# Patient Record
Sex: Male | Born: 1990 | Race: White | Hispanic: No | Marital: Single | Smoking: Current every day smoker
Health system: Southern US, Community
[De-identification: ages and names within clinical notes are randomized; demographics above are authoritative.]

---
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
## 2011-07-14 ENCOUNTER — Emergency Department: Payer: Self-pay | Admitting: Emergency Medicine

## 2011-07-14 LAB — URINALYSIS, COMPLETE
Leukocyte Esterase: NEGATIVE
Nitrite: NEGATIVE
Ph: 5 (ref 4.5–8.0)
Protein: NEGATIVE
RBC,UR: 3 /HPF (ref 0–5)

## 2012-05-02 ENCOUNTER — Emergency Department: Payer: Self-pay | Admitting: Emergency Medicine

## 2012-05-31 ENCOUNTER — Emergency Department: Payer: Self-pay | Admitting: Emergency Medicine

## 2012-05-31 LAB — COMPREHENSIVE METABOLIC PANEL
Albumin: 5.4 g/dL — ABNORMAL HIGH (ref 3.4–5.0)
Alkaline Phosphatase: 107 U/L (ref 50–136)
Bilirubin,Total: 0.3 mg/dL (ref 0.2–1.0)
Calcium, Total: 8.9 mg/dL (ref 8.5–10.1)
Co2: 26 mmol/L (ref 21–32)
Creatinine: 1.03 mg/dL (ref 0.60–1.30)
EGFR (Non-African Amer.): >60
Glucose: 86 mg/dL (ref 65–99)
Osmolality: 285 (ref 275–301)
Potassium: 3.9 mmol/L (ref 3.5–5.1)
Total Protein: 8.9 g/dL — ABNORMAL HIGH (ref 6.4–8.2)

## 2012-05-31 LAB — CBC WITH DIFFERENTIAL/PLATELET
Basophil #: 0.1 10*3/uL (ref 0.0–0.1)
HCT: 45 % (ref 40.0–52.0)
Lymphocyte %: 13.5 %
MCV: 87 fL (ref 80–100)
Monocyte %: 7.4 %
Neutrophil #: 9.8 10*3/uL — ABNORMAL HIGH (ref 1.4–6.5)
Neutrophil %: 78.5 %
Platelet: 210 10*3/uL (ref 150–440)
RBC: 5.16 10*6/uL (ref 4.40–5.90)
WBC: 12.5 10*3/uL — ABNORMAL HIGH (ref 3.8–10.6)

## 2012-05-31 LAB — URINALYSIS, COMPLETE
Bilirubin,UR: NEGATIVE
Ketone: NEGATIVE
Leukocyte Esterase: NEGATIVE
Ph: 5 (ref 4.5–8.0)
Protein: 30
Specific Gravity: 1.006 (ref 1.003–1.030)

## 2012-05-31 LAB — DRUG SCREEN, URINE
Amphetamines, Ur Screen: NEGATIVE (ref ?–1000)
Cannabinoid 50 Ng, Ur ~~LOC~~: NEGATIVE (ref ?–50)
Cocaine Metabolite,Ur ~~LOC~~: POSITIVE (ref ?–300)
Methadone, Ur Screen: NEGATIVE (ref ?–300)
Opiate, Ur Screen: POSITIVE (ref ?–300)
Phencyclidine (PCP) Ur S: NEGATIVE (ref ?–25)
Tricyclic, Ur Screen: NEGATIVE (ref ?–1000)

## 2012-05-31 LAB — CBC
HCT: 47.7 % (ref 40.0–52.0)
HGB: 16.3 g/dL (ref 13.0–18.0)
MCH: 29.5 pg (ref 26.0–34.0)
MCHC: 34.1 g/dL (ref 32.0–36.0)
MCV: 86 fL (ref 80–100)
Platelet: 260 10*3/uL (ref 150–440)
RBC: 5.53 10*6/uL (ref 4.40–5.90)
WBC: 20.3 10*3/uL — ABNORMAL HIGH (ref 3.8–10.6)

## 2012-05-31 LAB — ETHANOL: Ethanol: 242 mg/dL

## 2012-05-31 LAB — TSH: Thyroid Stimulating Horm: 3.75 u[IU]/mL

## 2013-06-11 ENCOUNTER — Ambulatory Visit: Payer: Self-pay | Admitting: Family Medicine

## 2014-05-20 ENCOUNTER — Emergency Department: Payer: Self-pay | Admitting: Emergency Medicine

## 2014-05-20 LAB — CBC
HCT: 43.4 % (ref 40.0–52.0)
HGB: 14.3 g/dL (ref 13.0–18.0)
MCH: 28.8 pg (ref 26.0–34.0)
MCHC: 32.9 g/dL (ref 32.0–36.0)
MCV: 88 fL (ref 80–100)
PLATELETS: 248 10*3/uL (ref 150–440)
RBC: 4.94 10*6/uL (ref 4.40–5.90)
RDW: 13 % (ref 11.5–14.5)
WBC: 10.1 10*3/uL (ref 3.8–10.6)

## 2014-05-20 LAB — COMPREHENSIVE METABOLIC PANEL
ALBUMIN: 4.1 g/dL (ref 3.4–5.0)
ANION GAP: 7 (ref 7–16)
AST: 30 U/L (ref 15–37)
Alkaline Phosphatase: 95 U/L
BUN: 14 mg/dL (ref 7–18)
Bilirubin,Total: 0.3 mg/dL (ref 0.2–1.0)
CALCIUM: 9.2 mg/dL (ref 8.5–10.1)
CHLORIDE: 106 mmol/L (ref 98–107)
CO2: 29 mmol/L (ref 21–32)
Creatinine: 1.1 mg/dL (ref 0.60–1.30)
EGFR (Non-African Amer.): >60
Glucose: 80 mg/dL (ref 65–99)
Osmolality: 283 (ref 275–301)
POTASSIUM: 3.6 mmol/L (ref 3.5–5.1)
SGPT (ALT): 24 U/L
SODIUM: 142 mmol/L (ref 136–145)
Total Protein: 7.5 g/dL (ref 6.4–8.2)

## 2014-05-20 LAB — URINALYSIS, COMPLETE
Bacteria: NONE SEEN
Bilirubin,UR: NEGATIVE
Glucose,UR: NEGATIVE mg/dL (ref 0–75)
Ketone: NEGATIVE
Leukocyte Esterase: NEGATIVE
NITRITE: NEGATIVE
PROTEIN: NEGATIVE
Ph: 6 (ref 4.5–8.0)
RBC,UR: 11 /HPF (ref 0–5)
SPECIFIC GRAVITY: 1.016 (ref 1.003–1.030)
Squamous Epithelial: NONE SEEN

## 2014-05-20 LAB — CK: CK, Total: 308 U/L (ref 39–308)

## 2014-05-20 LAB — CK-MB: CK-MB: 2.2 ng/mL (ref 0.5–3.6)

## 2014-09-05 NOTE — Consult Note (Signed)
Details:    - Psychiatry: Patient seen. Patient is now sober and is cam and showing no sign of withdrawl. Affect calm and patient is lucid. Totally denies suicidal ideation and is not psychotic or manic and has improved insight. Vitals and labs reviewed. Patient does not need inpatient treatment. Education and supportive therapy done. Rec discharge patient home with instruction not to drink alcohol and a [facility]. Dx alcohhol abuse, mood disorder nos, ro ADHD. Discussed with ER MD who agrees to the plan.   Electronic Signatures: Audery Amellapacs, Sheral Pfahler T (MD)  (Signed 17-Jan-14 15:23)  Authored: Details   Last Updated: 17-Jan-14 15:23 by Audery Amellapacs, Deni Lefever T (MD)

## 2014-09-05 NOTE — Consult Note (Signed)
Brief Consult Note: Diagnosis: Bipolar Do NOS, Alcohol Abuse.   Patient was seen by consultant.   Consult note dictated.   Orders entered.   Comments: Pt to be monitored in ED for further stabilization and safety.  Once stable, [facility]  Electronic Signatures: Rhunette CroftFaheem, Abdulrahman Bracey S (MD)  (Signed 16-Jan-14 16:48)  Authored: Brief Consult Note   Last Updated: 16-Jan-14 16:48 by Rhunette CroftFaheem, Najwa Spillane S (MD)
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
## 2014-10-08 ENCOUNTER — Emergency Department (HOSPITAL_COMMUNITY): Payer: Worker's Compensation

## 2014-10-08 ENCOUNTER — Emergency Department (HOSPITAL_COMMUNITY)
Admission: EM | Admit: 2014-10-08 | Discharge: 2014-10-08 | Disposition: A | Discharge status: Home / Self Care | Payer: Worker's Compensation | Attending: Emergency Medicine | Admitting: Emergency Medicine

## 2014-10-08 ENCOUNTER — Encounter (HOSPITAL_COMMUNITY): Payer: Self-pay | Admitting: *Deleted

## 2014-10-08 DIAGNOSIS — Y9389 Activity, other specified: Secondary | ICD-10-CM | POA: Diagnosis not present

## 2014-10-08 DIAGNOSIS — S0990XA Unspecified injury of head, initial encounter: Secondary | ICD-10-CM | POA: Diagnosis not present

## 2014-10-08 DIAGNOSIS — Z720 Tobacco use: Secondary | ICD-10-CM | POA: Insufficient documentation

## 2014-10-08 DIAGNOSIS — Y9289 Other specified places as the place of occurrence of the external cause: Secondary | ICD-10-CM | POA: Insufficient documentation

## 2014-10-08 DIAGNOSIS — W19XXXA Unspecified fall, initial encounter: Secondary | ICD-10-CM

## 2014-10-08 DIAGNOSIS — S8991XA Unspecified injury of right lower leg, initial encounter: Secondary | ICD-10-CM | POA: Diagnosis not present

## 2014-10-08 DIAGNOSIS — W11XXXA Fall on and from ladder, initial encounter: Secondary | ICD-10-CM | POA: Diagnosis not present

## 2014-10-08 DIAGNOSIS — S20229A Contusion of unspecified back wall of thorax, initial encounter: Secondary | ICD-10-CM | POA: Insufficient documentation

## 2014-10-08 DIAGNOSIS — S99911A Unspecified injury of right ankle, initial encounter: Secondary | ICD-10-CM | POA: Diagnosis not present

## 2014-10-08 DIAGNOSIS — F0390 Unspecified dementia without behavioral disturbance: Secondary | ICD-10-CM | POA: Diagnosis not present

## 2014-10-08 DIAGNOSIS — S8992XA Unspecified injury of left lower leg, initial encounter: Secondary | ICD-10-CM | POA: Diagnosis not present

## 2014-10-08 DIAGNOSIS — Y990 Civilian activity done for income or pay: Secondary | ICD-10-CM | POA: Diagnosis not present

## 2014-10-08 DIAGNOSIS — S3992XA Unspecified injury of lower back, initial encounter: Secondary | ICD-10-CM | POA: Diagnosis present

## 2014-10-08 MED ORDER — HYDROCODONE-ACETAMINOPHEN 5-325 MG PO TABS: 1.0000 | ORAL_TABLET | Freq: Four times a day (QID) | ORAL | Status: DC | PRN

## 2014-10-08 MED ORDER — IBUPROFEN 600 MG PO TABS: 600.0000 mg | ORAL_TABLET | Freq: Four times a day (QID) | ORAL | Status: DC | PRN

## 2014-10-08 MED ORDER — KETOROLAC TROMETHAMINE 60 MG/2ML IM SOLN
60.0000 mg | Freq: Once | INTRAMUSCULAR | Status: AC
  Administered 2014-10-08: 60 mg via INTRAMUSCULAR
  Filled 2014-10-08: qty 2

## 2014-10-08 MED ORDER — DIAZEPAM 5 MG PO TABS
5.0000 mg | ORAL_TABLET | Freq: Once | ORAL | Status: AC
  Administered 2014-10-08: 5 mg via ORAL
  Filled 2014-10-08: qty 1

## 2014-10-08 MED ORDER — HYDROMORPHONE HCL 1 MG/ML IJ SOLN
1.0000 mg | Freq: Once | INTRAMUSCULAR | Status: AC
  Administered 2014-10-08: 1 mg via INTRAMUSCULAR
  Filled 2014-10-08: qty 1

## 2014-10-08 NOTE — ED Notes (Signed)
Per EMS pt coming from work with c/o fall of ladder (12 feet), pt reports low back pain.

## 2014-10-08 NOTE — ED Provider Notes (Signed)
CSN: [id number]     Arrival date & time 10/08/14  1106 History   First MD Initiated Contact with Patient 10/08/14 1107     Chief Complaint  Patient presents with  . Fall     (Consider location/radiation/quality/duration/timing/severity/associated sxs/prior Treatment) HPI Jared Meyer is a 24 y.o. male with the medical problems, presents to emergency department after a fall of the latter. Patient states he was on the ladder working on the second floor window, when he slid off the ladder and fell down. States he fell backwards onto grass. He reports hitting his head. Reports severe pain in the lower back and right ankle. Denies loss of consciousness. No dizziness, nausea, vomiting, confusion. He states he was unable to get up because of the back pain. EMS was called. Patient denies any history of prior back problems. He denies any numbness or weakness in extremities. He denies any bladder or bowel loss. Pt was immobilized in a c-collar and long spine board by EMS.     History reviewed. No pertinent past medical history. History reviewed. No pertinent past surgical history. No family history on file. History  Substance Use Topics  . Smoking status: Current Every Day Smoker -- 1.00 packs/day    Types: Cigarettes  . Smokeless tobacco: Not on file  . Alcohol Use: Yes     Comment: occasionally    Review of Systems  Constitutional: Negative for fever and chills.  Respiratory: Negative for cough, chest tightness and shortness of breath.   Cardiovascular: Negative for chest pain, palpitations and leg swelling.  Gastrointestinal: Negative for nausea, vomiting, abdominal pain, diarrhea and abdominal distention.  Genitourinary: Negative for dysuria, urgency, frequency and hematuria.  Musculoskeletal: Positive for back pain, joint swelling and arthralgias. Negative for myalgias, neck pain and neck stiffness.  Skin: Negative for rash.  Allergic/Immunologic: Negative for immunocompromised state.   Neurological: Positive for headaches. Negative for dizziness, weakness, light-headedness and numbness.  All other systems reviewed and are negative.     Allergies  Review of patient's allergies indicates no known allergies.  Home Medications   Prior to Admission medications   Not on File   BP 116/63 mmHg  Pulse 66  Temp(Src) 98.2 F (36.8 C) (Oral)  Resp 14  SpO2 98% Physical Exam  Constitutional: He is oriented to person, place, and time. He appears well-developed and well-nourished. No distress.  HENT:  Head: Normocephalic and atraumatic.  No hemotympanum  Eyes: Conjunctivae and EOM are normal. Pupils are equal, round, and reactive to light.  Neck: Normal range of motion. Neck supple.  Cardiovascular: Normal rate, regular rhythm and normal heart sounds.   Pulmonary/Chest: Effort normal. No respiratory distress. He has no wheezes. He has no rales.  Abdominal: Soft. Bowel sounds are normal. He exhibits no distension. There is no tenderness. There is no rebound.  Musculoskeletal: He exhibits no edema.  Bruising and small abrasion to the lumbar spine. No midline thoracic spine tenderness. Tender to palpation of the lumbar spine and paravertebral muscles. Pain with bilateral straight leg raise. 4 dementia bilateral arms and legs. Tender palpation of her medial malleolus of right ankle. Full range of motion of the ankle.  Neurological: He is alert and oriented to person, place, and time. No cranial nerve deficit. Coordination normal.  5 out of 5 and equal upper and lower extremity strength bilaterally.  Skin: Skin is warm and dry.  Nursing note and vitals reviewed.   ED Course  Procedures (including critical care time) Labs Review Labs  Reviewed - No data to display  Imaging Review Dg Lumbar Spine Complete  10/08/2014   CLINICAL DATA:  Twenty-one 66-year-old who fell approximately 20 feet off of a ladder earlier today. Low back pain. Initial encounter.  EXAM: LUMBAR SPINE -  COMPLETE 4+ VIEW  COMPARISON:  None.  FINDINGS: Five non rib-bearing lumbar vertebrae with anatomic alignment posteriorly. No fractures. Well preserved disc spaces. Slight thoracolumbar scoliosis convex right. No pars defects. No significant facet arthropathy. Visualized sacroiliac joints intact.  IMPRESSION: 1. No acute osseous abnormality. 2. Straightening of the usual lordosis which may reflect positioning or spasm. 3. Slight thoracolumbar scoliosis convex right.   Electronically Signed   By: Hulan Saas M.D.   On: 10/08/2014 12:21   Dg Ankle Complete Right  10/08/2014   CLINICAL DATA:  20 foot fall this morning, right foot and ankle injury with soft tissue swelling laterally, initial encounter.  EXAM: RIGHT ANKLE - COMPLETE 3+ VIEW  COMPARISON:  None.  FINDINGS: No acute osseous or joint abnormality.  IMPRESSION: No acute osseous or joint abnormality.   Electronically Signed   By: Leanna Battles M.D.   On: 10/08/2014 12:23   Ct Head Wo Contrast  10/08/2014   CLINICAL DATA:  Larey Seat while working replacing a window 20 feet from ladder. Headache and back pain.  EXAM: CT HEAD WITHOUT CONTRAST  CT CERVICAL SPINE WITHOUT CONTRAST  TECHNIQUE: Multidetector CT imaging of the head and cervical spine was performed following the standard protocol without intravenous contrast. Multiplanar CT image reconstructions of the cervical spine were also generated.  COMPARISON:  None.  FINDINGS: CT HEAD FINDINGS  Ventricles, cisterns and other CSF spaces are within normal. There is no mass, mass effect, shift of midline structures or acute hemorrhage. There is no evidence of acute infarction. Mild irregularity to the nasal bone which may be from acute or chronic injury. Remaining bony structures and soft tissues are within normal.  CT CERVICAL SPINE FINDINGS  Vertebral body alignment, heights and disc spaces are normal. Prevertebral soft tissues as well as the atlantoaxial articulation are normal. There is no acute fracture  or subluxation.  IMPRESSION: No acute intracranial findings.  No acute cervical spine injury.   Electronically Signed   By: Elberta Fortis M.D.   On: 10/08/2014 12:15   Ct Cervical Spine Wo Contrast  10/08/2014   CLINICAL DATA:  Larey Seat while working replacing a window 20 feet from ladder. Headache and back pain.  EXAM: CT HEAD WITHOUT CONTRAST  CT CERVICAL SPINE WITHOUT CONTRAST  TECHNIQUE: Multidetector CT imaging of the head and cervical spine was performed following the standard protocol without intravenous contrast. Multiplanar CT image reconstructions of the cervical spine were also generated.  COMPARISON:  None.  FINDINGS: CT HEAD FINDINGS  Ventricles, cisterns and other CSF spaces are within normal. There is no mass, mass effect, shift of midline structures or acute hemorrhage. There is no evidence of acute infarction. Mild irregularity to the nasal bone which may be from acute or chronic injury. Remaining bony structures and soft tissues are within normal.  CT CERVICAL SPINE FINDINGS  Vertebral body alignment, heights and disc spaces are normal. Prevertebral soft tissues as well as the atlantoaxial articulation are normal. There is no acute fracture or subluxation.  IMPRESSION: No acute intracranial findings.  No acute cervical spine injury.   Electronically Signed   By: Elberta Fortis M.D.   On: 10/08/2014 12:15     EKG Interpretation None      MDM  Final diagnoses:  Fall, initial encounter  Minor head injury, initial encounter  Back contusion, unspecified laterality, initial encounter   Patient here is after a fall approximately 10-12 feet. Landed on grass on the back. Complaining of back pain and headache. No loss of consciousness. He is neurovascular intact. Removed from long spine board using spine precautions. We'll get x-rays and CT of the head and cervical spine. No chest pain, lungs are clear and equal sounds bilaterally. Abdomen is benign.   Patient CTs and x-rays are negative. He  ambulated up and down the hallway with no difficulties. He has no pain over the range of motion of the neck. Strength intact in all directions. Plan to discharge home with Norco, ibuprofen, follow up as needed.  Filed Vitals:   10/08/14 1107 10/08/14 1300  BP: 116/63 111/52  Pulse: 66 78  Temp: 98.2 F (36.8 C)   TempSrc: Oral   Resp: 14 14  SpO2: 98% 100%     Jaynie Crumbleatyana Ashad Fawbush, PA-C 10/08/14 1520  Azalia BilisKevin Campos, MD 10/09/14 [phone]

## 2014-10-08 NOTE — Discharge Instructions (Signed)
Ice. Rest. Ibuprofen for pain. norco for severe pain. Follow up with primary care doctor for recheck.   Back Pain, Adult Low back pain is very common. About 1 in 5 people have back pain.The cause of low back pain is rarely dangerous. The pain often gets better over time.About half of people with a sudden onset of back pain feel better in just 2 weeks. About 8 in 10 people feel better by 6 weeks.  CAUSES Some common causes of back pain include:  Strain of the muscles or ligaments supporting the spine.  Wear and tear (degeneration) of the spinal discs.  Arthritis.  Direct injury to the back. DIAGNOSIS Most of the time, the direct cause of low back pain is not known.However, back pain can be treated effectively even when the exact cause of the pain is unknown.Answering your caregiver's questions about your overall health and symptoms is one of the most accurate ways to make sure the cause of your pain is not dangerous. If your caregiver needs more information, he or she may order lab work or imaging tests (X-rays or MRIs).However, even if imaging tests show changes in your back, this usually does not require surgery. HOME CARE INSTRUCTIONS For many people, back pain returns.Since low back pain is rarely dangerous, it is often a condition that people can learn to [facility] their own.   Remain active. It is stressful on the back to sit or stand in one place. Do not sit, drive, or stand in one place for more than 30 minutes at a time. Take short walks on level surfaces as soon as pain allows.Try to increase the length of time you walk each day.  Do not stay in bed.Resting more than 1 or 2 days can delay your recovery.  Do not avoid exercise or work.Your body is made to move.It is not dangerous to be active, even though your back may hurt.Your back will likely heal faster if you return to being active before your pain is gone.  Pay attention to your body when you bend and lift. Many  people have less discomfortwhen lifting if they bend their knees, keep the load close to their bodies,and avoid twisting. Often, the most comfortable positions are those that put less stress on your recovering back.  Find a comfortable position to sleep. Use a firm mattress and lie on your side with your knees slightly bent. If you lie on your back, put a pillow under your knees.  Only take over-the-counter or prescription medicines as directed by your caregiver. Over-the-counter medicines to reduce pain and inflammation are often the most helpful.Your caregiver may prescribe muscle relaxant drugs.These medicines help dull your pain so you can more quickly return to your normal activities and healthy exercise.  Put ice on the injured area.  Put ice in a plastic bag.  Place a towel between your skin and the bag.  Leave the ice on for 15-20 minutes, 03-04 times a day for the first 2 to 3 days. After that, ice and heat may be alternated to reduce pain and spasms.  Ask your caregiver about trying back exercises and gentle massage. This may be of some benefit.  Avoid feeling anxious or stressed.Stress increases muscle tension and can worsen back pain.It is important to recognize when you are anxious or stressed and learn ways to manage it.Exercise is a great option. SEEK MEDICAL CARE IF:  You have pain that is not relieved with rest or medicine.  You have pain that  does not improve in 1 week.  You have new symptoms.  You are generally not feeling well. SEEK IMMEDIATE MEDICAL CARE IF:   You have pain that radiates from your back into your legs.  You develop new bowel or bladder control problems.  You have unusual weakness or numbness in your arms or legs.  You develop nausea or vomiting.  You develop abdominal pain.  You feel faint. Document Released: 05/02/2005 Document Revised: 11/01/2011 Document Reviewed: 09/03/2013 [employer] Patient Information 2015 [place], [place]. This  information is not intended to replace advice given to you by your health care provider. Make sure you discuss any questions you have with your health care provider.    Head Injury You have received a head injury. It does not appear serious at this time. Headaches and vomiting are common following head injury. It should be easy to awaken from sleeping. Sometimes it is necessary for you to stay in the emergency department for a while for observation. Sometimes admission to the hospital may be needed. After injuries such as yours, most problems occur within the first 24 hours, but side effects may occur up to 7-10 days after the injury. It is important for you to carefully monitor your condition and contact your health care provider or seek immediate medical care if there is a change in your condition. WHAT ARE THE TYPES OF HEAD INJURIES? Head injuries can be as minor as a bump. Some head injuries can be more severe. More severe head injuries include:  A jarring injury to the brain (concussion).  A bruise of the brain (contusion). This mean there is bleeding in the brain that can cause swelling.  A cracked skull (skull fracture).  Bleeding in the brain that collects, clots, and forms a bump (hematoma). WHAT CAUSES A HEAD INJURY? A serious head injury is most likely to happen to someone who is in a car wreck and is not wearing a seat belt. Other causes of major head injuries include bicycle or motorcycle accidents, sports injuries, and falls. HOW ARE HEAD INJURIES DIAGNOSED? A complete history of the event leading to the injury and your current symptoms will be helpful in diagnosing head injuries. Many times, pictures of the brain, such as CT or MRI are needed to see the extent of the injury. Often, an overnight hospital stay is necessary for observation.  WHEN SHOULD I SEEK IMMEDIATE MEDICAL CARE?  You should get help right away if:  You have confusion or drowsiness.  You feel sick to your  stomach (nauseous) or have continued, forceful vomiting.  You have dizziness or unsteadiness that is getting worse.  You have severe, continued headaches not relieved by medicine. Only take over-the-counter or prescription medicines for pain, fever, or discomfort as directed by your health care provider.  You do not have normal function of the arms or legs or are unable to walk.  You notice changes in the black spots in the center of the colored part of your eye (pupil).  You have a clear or bloody fluid coming from your nose or ears.  You have a loss of vision. During the next 24 hours after the injury, you must stay with someone who can watch you for the warning signs. This person should contact local emergency services (911 in the [place] if you have seizures, you become unconscious, or you are unable to wake up. HOW CAN I PREVENT A HEAD INJURY IN THE FUTURE? The most important factor for preventing major head injuries  is avoiding motor vehicle accidents. To minimize the potential for damage to your head, it is crucial to wear seat belts while riding in motor vehicles. Wearing helmets while bike riding and playing collision sports (like football) is also helpful. Also, avoiding dangerous activities around the house will further help reduce your risk of head injury.  WHEN CAN I RETURN TO NORMAL ACTIVITIES AND ATHLETICS? You should be reevaluated by your health care provider before returning to these activities. If you have any of the following symptoms, you should not return to activities or contact sports until 1 week after the symptoms have stopped:  Persistent headache.  Dizziness or vertigo.  Poor attention and concentration.  Confusion.  Memory problems.  Nausea or vomiting.  Fatigue or tire easily.  Irritability.  Intolerant of bright lights or loud noises.  Anxiety or depression.  Disturbed sleep. MAKE SURE YOU:   Understand these instructions.  Will watch your  condition.  Will get help right away if you are not doing well or get worse. Document Released: 05/02/2005 Document Revised: 05/07/2013 Document Reviewed: 01/07/2013 [facility] Patient Information 2015 [place], [place]. This information is not intended to replace advice given to you by your health care provider. Make sure you discuss any questions you have with your health care provider.

## 2015-06-10 ENCOUNTER — Emergency Department: Payer: Self-pay

## 2015-06-10 ENCOUNTER — Emergency Department
Admission: EM | Admit: 2015-06-10 | Discharge: 2015-06-10 | Disposition: A | Discharge status: Home / Self Care | Payer: Self-pay | Attending: Emergency Medicine | Admitting: Emergency Medicine

## 2015-06-10 ENCOUNTER — Encounter: Payer: Self-pay | Admitting: Emergency Medicine

## 2015-06-10 DIAGNOSIS — M545 Low back pain, unspecified: Secondary | ICD-10-CM

## 2015-06-10 DIAGNOSIS — R300 Dysuria: Secondary | ICD-10-CM | POA: Insufficient documentation

## 2015-06-10 DIAGNOSIS — R109 Unspecified abdominal pain: Secondary | ICD-10-CM | POA: Insufficient documentation

## 2015-06-10 DIAGNOSIS — F17210 Nicotine dependence, cigarettes, uncomplicated: Secondary | ICD-10-CM | POA: Insufficient documentation

## 2015-06-10 DIAGNOSIS — Z79899 Other long term (current) drug therapy: Secondary | ICD-10-CM | POA: Insufficient documentation

## 2015-06-10 LAB — URINALYSIS COMPLETE WITH MICROSCOPIC (ARMC ONLY)
Bacteria, UA: NONE SEEN
Bilirubin Urine: NEGATIVE
Glucose, UA: NEGATIVE mg/dL
Ketones, ur: NEGATIVE mg/dL
Leukocytes, UA: NEGATIVE
Nitrite: NEGATIVE
Protein, ur: NEGATIVE mg/dL
SPECIFIC GRAVITY, URINE: 1.021 (ref 1.005–1.030)
Squamous Epithelial / LPF: NONE SEEN
pH: 5 (ref 5.0–8.0)

## 2015-06-10 MED ORDER — KETOROLAC TROMETHAMINE 60 MG/2ML IM SOLN
60.0000 mg | Freq: Once | INTRAMUSCULAR | Status: AC
  Administered 2015-06-10: 60 mg via INTRAMUSCULAR
  Filled 2015-06-10: qty 2

## 2015-06-10 MED ORDER — ETODOLAC 400 MG PO TABS: 400.0000 mg | ORAL_TABLET | Freq: Two times a day (BID) | ORAL | Status: AC

## 2015-06-10 NOTE — ED Notes (Signed)
Patient presents to the ED with bilateral flank pain and dysuria that started this morning.  Patient reports history of kidney stones.  Patient reports halting urination today.

## 2015-06-10 NOTE — ED Provider Notes (Signed)
[facility] Emergency Department Provider Note  ____________________________________________  Time seen: Approximately 12:09 PM  I have reviewed the triage vital signs and the nursing notes.   HISTORY  Chief Complaint Flank Pain and Dysuria   HPI Jared Meyer is a 25 y.o. male is here today with complaint of bilateral flank pain and dysuria since morning. Patient states he has a history of kidney stones but not since he was 25 years old. He states he began hurting at work today and his boss gave him a Microbiologist and brought into the emergency room. Patient has not seen any hematuria. He denies any nausea or vomiting. He denies any fever or chills. There is been no paresthesias in the lower extremities nor has he had any incontinence of bowel or bladder. He also has had problems with his back in the past but denies any history of injury. He rates his pain as an 8 out of 10.   History reviewed. No pertinent past medical history.  There are no active problems to display for this patient.   History reviewed. No pertinent past surgical history.  Current Outpatient Rx  Name  Route  Sig  Dispense  Refill  . cetirizine (ZYRTEC) 10 MG tablet   Oral   Take 10 mg by mouth daily.         Marland Kitchen etodolac (LODINE) 400 MG tablet   Oral   Take 1 tablet (400 mg total) by mouth 2 (two) times daily.   20 tablet   0   . fluticasone (FLONASE) 50 MCG/ACT nasal spray   Each Nare   Place 2 sprays into both nostrils 2 (two) times daily.           Allergies Review of patient's allergies indicates no known allergies.  No family history on file.  Social History Social History  Substance Use Topics  . Smoking status: Current Every Day Smoker -- 0.50 packs/day    Types: Cigarettes  . Smokeless tobacco: None  . Alcohol Use: Yes     Comment: occasionally    Review of Systems Constitutional: No fever/chills Cardiovascular: Denies chest pain. Respiratory: Denies  shortness of breath. Gastrointestinal: No abdominal pain.  No nausea, no vomiting.  Genitourinary: Positive for dysuria. Musculoskeletal: Positive for back pain. Skin: Negative for rash. Neurological: Negative for headaches, focal weakness or numbness.  10-point ROS otherwise negative.  ____________________________________________   PHYSICAL EXAM:  VITAL SIGNS: ED Triage Vitals  Enc Vitals Group     BP 06/10/15 1150 133/76 mmHg     Pulse Rate 06/10/15 1150 68     Resp 06/10/15 1150 16     Temp 06/10/15 1150 97.9 F (36.6 C)     Temp Source 06/10/15 1150 Oral     SpO2 06/10/15 1150 100 %     Weight 06/10/15 1150 160 lb (72.576 kg)     Height 06/10/15 1150  (1.778 m)     Head Cir --      Peak Flow --      Pain Score 06/10/15 1153 8     Pain Loc --      Pain Edu? --      Excl. in GC? --     Constitutional: Alert and oriented. Well appearing and in no acute distress. Eyes: Conjunctivae are normal. PERRL. EOMI. Head: Atraumatic. Nose: No congestion/rhinnorhea. Neck: No stridor.   Cardiovascular: Normal rate, regular rhythm. Grossly normal heart sounds.  Good peripheral circulation. Respiratory: Normal respiratory effort.  No retractions. Lungs CTAB. Gastrointestinal: Soft and nontender. No distention. Bowel sounds are present 4 quadrants. No CVA tenderness was noted. Musculoskeletal: Examination of the back there is no gross deformity. There is moderate tenderness on palpation of the sacrum and bilateral paravertebral muscles. There is minimal muscle spasm seen. There is increased pain with straight leg raises at approximately 30. Neurologic:  Normal speech and language. No gross focal neurologic deficits are appreciated. No gait instability. Reflexes 2+ bilaterally. Skin:  Skin is warm, dry and intact. No rash noted. Psychiatric: Mood and affect are normal. Speech and behavior are normal.  ____________________________________________   LABS (all labs ordered are  listed, but only abnormal results are displayed)  Labs Reviewed  URINALYSIS COMPLETEWITH MICROSCOPIC ([facility] ONLY) - Abnormal; Notable for the following:    Color, Urine YELLOW (*)    APPearance CLEAR (*)    Hgb urine dipstick 3+ (*)    All other components within normal limits   _RADIOLOGY  CT scan per radiologist showed no evidence of stone or acute findings. ____________________________________________   PROCEDURES  Procedure(s) performed: None  Critical Care performed: No  ____________________________________________   INITIAL IMPRESSION / ASSESSMENT AND PLAN / ED COURSE  Pertinent labs & imaging results that were available during my care of the patient were reviewed by me and considered in my medical decision making (see chart for details).  Patient was given a prescription for etodolac 400 mg twice a day with food. Patient is follow-up with Dr. Rosita Kea  if any continued problems with his back. ____________________________________________   FINAL CLINICAL IMPRESSION(S) / ED DIAGNOSES  Final diagnoses:  Flank pain  Acute bilateral low back pain without sciatica      Tommi Rumps, PA-C 06/10/15 1734  Rockne Menghini, MD 06/17/15 2247

## 2015-06-10 NOTE — Discharge Instructions (Signed)
Follow-up with [facility] clinic if any continued problems. Take medication as prescribed.

## 2017-04-25 IMAGING — CT CT RENAL STONE PROTOCOL
1 of 2 series · 4 of 32 positions shown, 9 images · non-contrast
Comparison: 05/20/2014

CLINICAL DATA: Bilateral flank pain, hematuria, and dysuria
beginning this morning. Nephrolithiasis.

EXAM:
CT ABDOMEN AND PELVIS WITHOUT CONTRAST
TECHNIQUE: Multidetector CT imaging of the abdomen and pelvis was performed
following the standard protocol without IV contrast.

[Series 4: lung windows · axial · 0.64mm/px · z∈[-123,-58]mm · 4 of 23 slices shown, 9 images]
[im 5/23  soft-tissue]
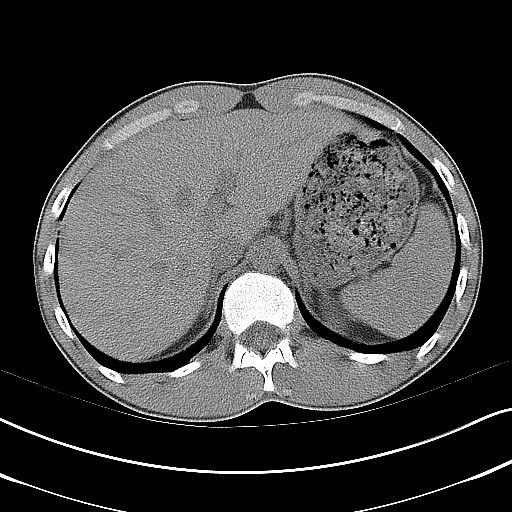
[im 5/23  lung]
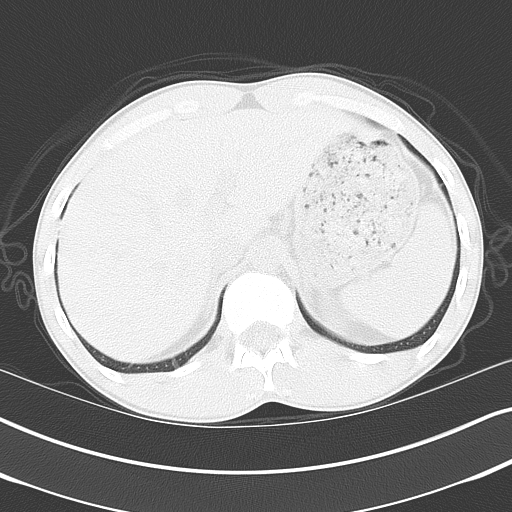
[im 5/23  bone]
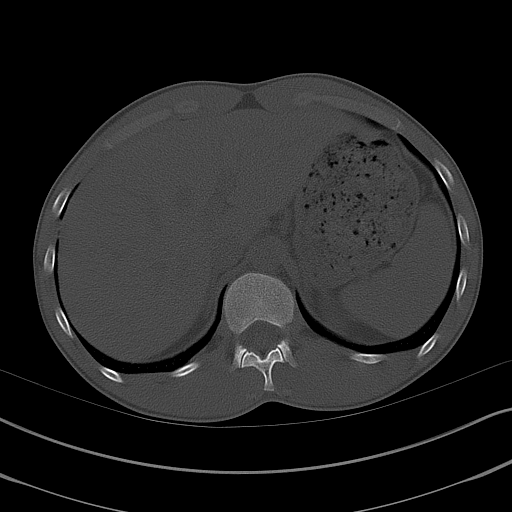
[im 9/23  soft-tissue]
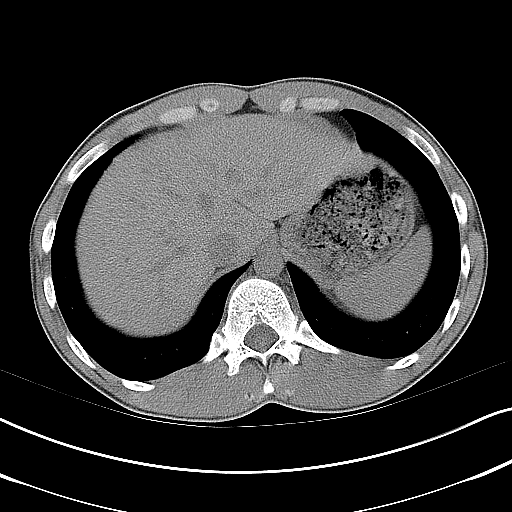
[im 9/23  lung]
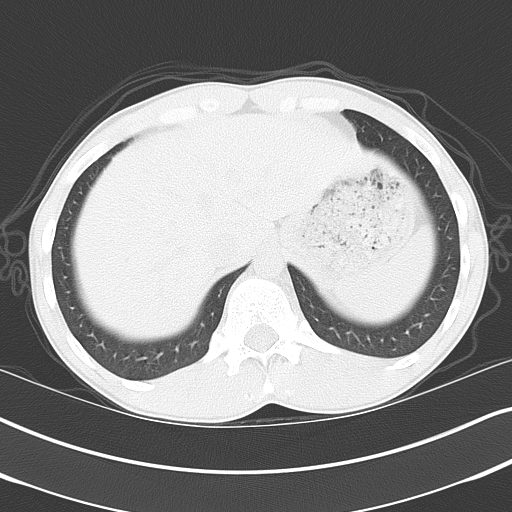
[im 14/23  soft-tissue]
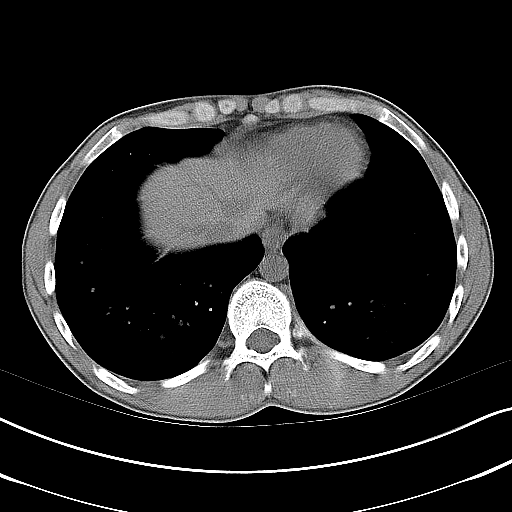
[im 14/23  lung]
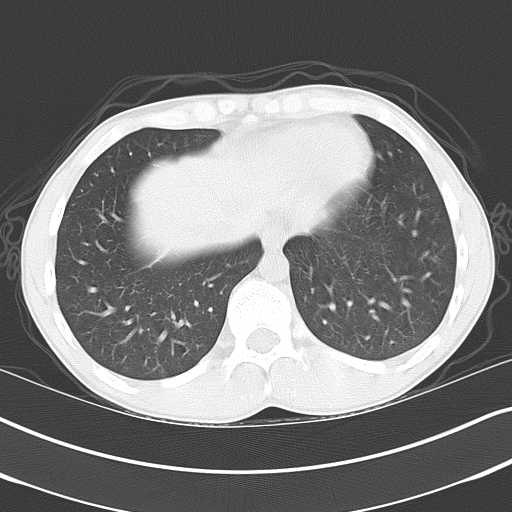
[im 18/23  soft-tissue]
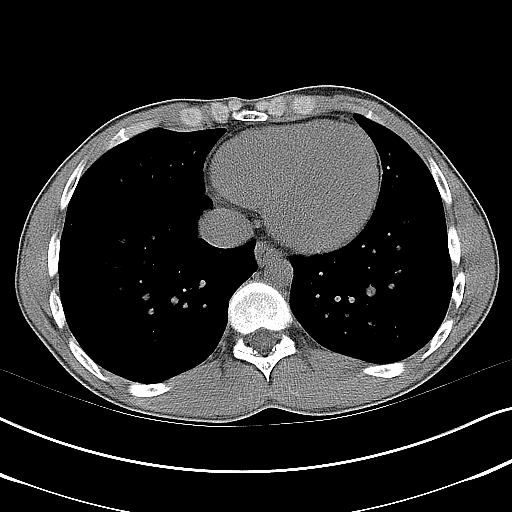
[im 18/23  lung]
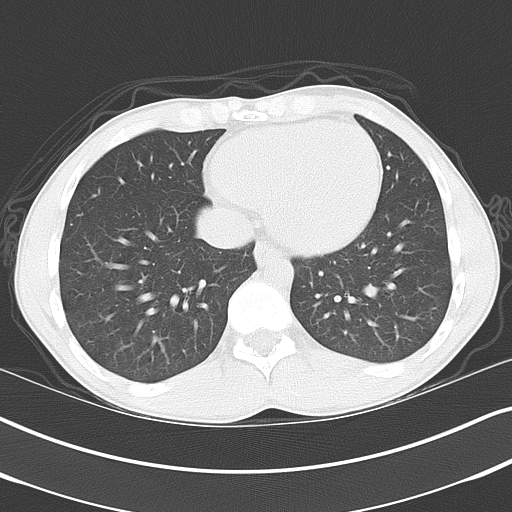

[4 of 32 positions shown; findings below may reference images not displayed]

FINDINGS: Lower chest:  No acute findings.

Hepatobiliary: No mass visualized on this un-enhanced exam.
Gallbladder is unremarkable.

Pancreas: No mass or inflammatory process identified on this
un-enhanced exam.

Spleen: Within normal limits in size.

Adrenals/Urinary Tract: No evidence of urolithiasis or
hydronephrosis. No definite mass visualized on this un-enhanced
exam.

Stomach/Bowel: No evidence of obstruction, inflammatory process, or
abnormal fluid collections. Large amount of stool seen throughout
colon which appears increased since previous study.

Vascular/Lymphatic: No pathologically enlarged lymph nodes. No
evidence of abdominal aortic aneurysm.

Reproductive: No mass or other significant abnormality.

Other: None.

Musculoskeletal:  No suspicious bone lesions identified.
IMPRESSION: No evidence of urolithiasis, hydronephrosis, or other acute
findings.

Large stool burden noted; suggest clinical correlation for possible
constipation.

## 2018-01-29 ENCOUNTER — Emergency Department
Admission: EM | Admit: 2018-01-29 | Discharge: 2018-01-29 | Disposition: A | Discharge status: Home / Self Care | Payer: Self-pay | Attending: Emergency Medicine | Admitting: Emergency Medicine

## 2018-01-29 ENCOUNTER — Encounter: Payer: Self-pay | Admitting: Emergency Medicine

## 2018-01-29 ENCOUNTER — Other Ambulatory Visit: Payer: Self-pay

## 2018-01-29 DIAGNOSIS — Z79899 Other long term (current) drug therapy: Secondary | ICD-10-CM | POA: Insufficient documentation

## 2018-01-29 DIAGNOSIS — F17210 Nicotine dependence, cigarettes, uncomplicated: Secondary | ICD-10-CM | POA: Insufficient documentation

## 2018-01-29 DIAGNOSIS — L02215 Cutaneous abscess of perineum: Secondary | ICD-10-CM | POA: Insufficient documentation

## 2018-01-29 MED ORDER — SULFAMETHOXAZOLE-TRIMETHOPRIM 800-160 MG PO TABS
1.0000 | ORAL_TABLET | Freq: Once | ORAL | Status: AC
  Administered 2018-01-29: 1 via ORAL
  Filled 2018-01-29: qty 1

## 2018-01-29 MED ORDER — OXYCODONE-ACETAMINOPHEN 5-325 MG PO TABS
1.0000 | ORAL_TABLET | Freq: Once | ORAL | Status: AC
  Administered 2018-01-29: 1 via ORAL
  Filled 2018-01-29: qty 1

## 2018-01-29 MED ORDER — SULFAMETHOXAZOLE-TRIMETHOPRIM 800-160 MG PO TABS: 1.0000 | ORAL_TABLET | Freq: Two times a day (BID) | ORAL | 0 refills | Status: AC

## 2018-01-29 MED ORDER — LIDOCAINE HCL (PF) 1 % IJ SOLN
10.0000 mL | Freq: Once | INTRAMUSCULAR | Status: AC
  Administered 2018-01-29: 10 mL
  Filled 2018-01-29: qty 10

## 2018-01-29 MED ORDER — OXYCODONE-ACETAMINOPHEN 5-325 MG PO TABS: 1.0000 | ORAL_TABLET | Freq: Four times a day (QID) | ORAL | 0 refills | Status: AC | PRN

## 2018-01-29 NOTE — ED Notes (Signed)
See triage note  Presents with possible abscess area to groin  Noticed area about 3-4 days ago

## 2018-01-29 NOTE — ED Provider Notes (Signed)
[facility] Emergency Department Provider Note  ____________________________________________  Time seen: Approximately 7:14 PM  I have reviewed the triage vital signs and the nursing notes.   HISTORY  Chief Complaint Abscess    HPI Jared Meyer is a 27 y.o. male who presents the emergency department complaining of an abscess to the perineal region.  Patient reports that he has had 3 to 4 days of increasing pain, swelling to this area.  He is tried Tylenol, Motrin, warm compresses with no relief.  No history of recurrent skin infection.  No drainage from the site.  Patient denies any fevers or chills, abdominal pain, nausea vomiting, diarrhea or constipation.  No history of recurrent GI illnesses such as diverticular-itis, Crohn's, colitis.  No other complaints at this time.     History reviewed. No pertinent past medical history.  There are no active problems to display for this patient.   History reviewed. No pertinent surgical history.  Prior to Admission medications   Medication Sig Start Date End Date Taking? Authorizing Provider  cetirizine (ZYRTEC) 10 MG tablet Take 10 mg by mouth daily.    [provider]  etodolac (LODINE) 400 MG tablet Take 1 tablet (400 mg total) by mouth 2 (two) times daily. 06/10/15   Tommi RumpsSummers, Rhonda L, PA-C  fluticasone (FLONASE) 50 MCG/ACT nasal spray Place 2 sprays into both nostrils 2 (two) times daily.    [provider]  oxyCODONE-acetaminophen (PERCOCET/ROXICET) 5-325 MG tablet Take 1 tablet by mouth every 6 (six) hours as needed for severe pain. 01/29/18   Emeli Goguen, Delorise RoyalsJonathan D, PA-C  sulfamethoxazole-trimethoprim (BACTRIM DS,SEPTRA DS) 800-160 MG tablet Take 1 tablet by mouth 2 (two) times daily. 01/29/18   Joel Mericle, Delorise RoyalsJonathan D, PA-C    Allergies Patient has no known allergies.  No family history on file.  Social History Social History   Tobacco Use  . Smoking status: Current Every Day Smoker     Packs/day: 0.50    Types: Cigarettes  Substance Use Topics  . Alcohol use: Yes    Comment: occasionally  . Drug use: Not on file     Review of Systems  Constitutional: No fever/chills Eyes: No visual changes. No discharge ENT: No upper respiratory complaints. Cardiovascular: no chest pain. Respiratory: no cough. No SOB. Gastrointestinal: No abdominal pain.  No nausea, no vomiting.  No diarrhea.  No constipation. Genitourinary: Negative for dysuria. No hematuria Musculoskeletal: Negative for musculoskeletal pain. Skin: Positive for abscess to the perineal region Neurological: Negative for headaches, focal weakness or numbness. 10-point ROS otherwise negative.  ____________________________________________   PHYSICAL EXAM:  VITAL SIGNS: ED Triage Vitals  Enc Vitals Group     BP 01/29/18 1804 121/74     Pulse Rate 01/29/18 1804 100     Resp 01/29/18 1804 18     Temp 01/29/18 1804 98.7 F (37.1 C)     Temp Source 01/29/18 1804 Oral     SpO2 01/29/18 1804 98 %     Weight 01/29/18 1805 150 lb (68 kg)     Height 01/29/18 1805 5\' 9"  (1.753 m)     Head Circumference --      Peak Flow --      Pain Score 01/29/18 1804 10     Pain Loc --      Pain Edu? --      Excl. in GC? --      Constitutional: Alert and oriented. Well appearing and in no acute distress. Eyes: Conjunctivae are normal.  PERRL. EOMI. Head: Atraumatic. Neck: No stridor.    Cardiovascular: Normal rate, regular rhythm. Normal S1 and S2.  Good peripheral circulation. Respiratory: Normal respiratory effort without tachypnea or retractions. Lungs CTAB. Good air entry to the bases with no decreased or absent breath sounds. Gastrointestinal: Bowel sounds 4 quadrants. Soft and nontender to palpation. No guarding or rigidity. No palpable masses. No distention. No CVA tenderness.  Solitary reactive lymphadenopathy noted right inguinal region. Musculoskeletal: Full range of motion to all extremities. No gross  deformities appreciated. Neurologic:  Normal speech and language. No gross focal neurologic deficits are appreciated.  Skin:  Skin is warm, dry and intact. No rash noted.  Visualization of the perineal region reveals erythema, edema, induration to the right perineal/buttocks region.  No peri-rectal involvement.  No scrotal involvement.  Area is very tender to palpation, fluctuant.  No purulent drainage from site. Psychiatric: Mood and affect are normal. Speech and behavior are normal. Patient exhibits appropriate insight and judgement.   ____________________________________________   LABS (all labs ordered are listed, but only abnormal results are displayed)  Labs Reviewed - No data to display ____________________________________________  EKG   ____________________________________________  RADIOLOGY   No results found.  ____________________________________________    PROCEDURES  Procedure(s) performed:    Marland KitchenMarland KitchenIncision and Drainage Date/Time: 01/29/2018 7:59 PM Performed by: Racheal Patches, PA-C Authorized by: Racheal Patches, PA-C   Consent:    Consent obtained:  Verbal   Consent given by:  Patient   Risks discussed:  Pain and incomplete drainage Location:    Type:  Abscess   Location:  Anogenital   Anogenital location:  Perineum Pre-procedure details:    Skin preparation:  Betadine Anesthesia (see MAR for exact dosages):    Anesthesia method:  Local infiltration   Local anesthetic:  Lidocaine 1% w/o epi Procedure type:    Complexity:  Simple Procedure details:    Incision types:  Single straight   Incision depth:  Subcutaneous   Scalpel blade:  11   Wound management:  Probed and deloculated and irrigated with saline   Drainage:  Purulent   Drainage amount:  Moderate   Wound treatment:  Wound left open   Packing materials:  None Post-procedure details:    Patient tolerance of procedure:  Tolerated well, no immediate  complications      Medications  lidocaine (PF) (XYLOCAINE) 1 % injection 10 mL (10 mLs Infiltration Given by Other 01/29/18 1922)  sulfamethoxazole-trimethoprim (BACTRIM DS,SEPTRA DS) 800-160 MG per tablet 1 tablet (1 tablet Oral Given 01/29/18 1957)  oxyCODONE-acetaminophen (PERCOCET/ROXICET) 5-325 MG per tablet 1 tablet (1 tablet Oral Given 01/29/18 1957)     ____________________________________________   INITIAL IMPRESSION / ASSESSMENT AND PLAN / ED COURSE  Pertinent labs & imaging results that were available during my care of the patient were reviewed by me and considered in my medical decision making (see chart for details).  Review of the [place] CSRS was performed in accordance of the [facility] prior to dispensing any controlled drugs.      Patient's diagnosis is consistent with abscess to the perineal region.  Patient presents with complaint of erythematous, edematous, painful lesion in the perineal region.  Visualization reveals abscess with no drainage.  No indication for labs or imaging at this time.  This was incised and drained in the emergency department.  Abscess was superficial and did not require packing.  Patient was given Bactrim, Percocet for symptom relief.  Follow-up with primary care as needed. Patient is given  ED precautions to return to the ED for any worsening or new symptoms.     ____________________________________________  FINAL CLINICAL IMPRESSION(S) / ED DIAGNOSES  Final diagnoses:  Abscess of buttock, right      NEW MEDICATIONS STARTED DURING THIS VISIT:  ED Discharge Orders         Ordered    sulfamethoxazole-trimethoprim (BACTRIM DS,SEPTRA DS) 800-160 MG tablet  2 times daily     01/29/18 1956    oxyCODONE-acetaminophen (PERCOCET/ROXICET) 5-325 MG tablet  Every 6 hours PRN     01/29/18 1956              This chart was dictated using voice recognition software/Dragon. Despite best efforts to proofread, errors can occur which can change the  meaning. Any change was purely unintentional.    Racheal Patches, PA-C 01/29/18 2001    Minna Antis, MD 01/29/18 [phone]

## 2018-01-29 NOTE — ED Triage Notes (Signed)
Abscess R groin x 4 days

## 2018-06-07 ENCOUNTER — Emergency Department: Payer: Self-pay

## 2018-06-07 ENCOUNTER — Emergency Department
Admission: EM | Admit: 2018-06-07 | Discharge: 2018-06-08 | Disposition: A | Discharge status: Home / Self Care | Payer: Self-pay | Attending: Student in an Organized Health Care Education/Training Program | Admitting: Student in an Organized Health Care Education/Training Program

## 2018-06-07 ENCOUNTER — Other Ambulatory Visit: Payer: Self-pay

## 2018-06-07 ENCOUNTER — Encounter: Payer: Self-pay | Admitting: Emergency Medicine

## 2018-06-07 DIAGNOSIS — N50819 Testicular pain, unspecified: Secondary | ICD-10-CM

## 2018-06-07 DIAGNOSIS — W51XXXA Accidental striking against or bumped into by another person, initial encounter: Secondary | ICD-10-CM | POA: Insufficient documentation

## 2018-06-07 DIAGNOSIS — S3802XA Crushing injury of scrotum and testis, initial encounter: Secondary | ICD-10-CM | POA: Insufficient documentation

## 2018-06-07 DIAGNOSIS — Y9389 Activity, other specified: Secondary | ICD-10-CM | POA: Insufficient documentation

## 2018-06-07 DIAGNOSIS — F17210 Nicotine dependence, cigarettes, uncomplicated: Secondary | ICD-10-CM | POA: Insufficient documentation

## 2018-06-07 DIAGNOSIS — S3994XA Unspecified injury of external genitals, initial encounter: Secondary | ICD-10-CM

## 2018-06-07 DIAGNOSIS — Y999 Unspecified external cause status: Secondary | ICD-10-CM | POA: Insufficient documentation

## 2018-06-07 DIAGNOSIS — Y92009 Unspecified place in unspecified non-institutional (private) residence as the place of occurrence of the external cause: Secondary | ICD-10-CM | POA: Insufficient documentation

## 2018-06-07 LAB — URINALYSIS, COMPLETE (UACMP) WITH MICROSCOPIC
BILIRUBIN URINE: NEGATIVE
Bacteria, UA: NONE SEEN
Glucose, UA: NEGATIVE mg/dL
Ketones, ur: NEGATIVE mg/dL
LEUKOCYTES UA: NEGATIVE
Nitrite: NEGATIVE
Protein, ur: NEGATIVE mg/dL
Specific Gravity, Urine: 1.012 (ref 1.005–1.030)
pH: 6 (ref 5.0–8.0)

## 2018-06-07 MED ORDER — ONDANSETRON 4 MG PO TBDP: 4.0000 mg | ORAL_TABLET | Freq: Once | ORAL | Status: DC

## 2018-06-07 MED ORDER — HYDROCODONE-ACETAMINOPHEN 5-325 MG PO TABS
1.0000 | ORAL_TABLET | Freq: Once | ORAL | Status: AC
  Administered 2018-06-07: 1 via ORAL
  Filled 2018-06-07: qty 1

## 2018-06-07 MED ORDER — NAPROXEN 500 MG PO TABS: 500.0000 mg | ORAL_TABLET | Freq: Two times a day (BID) | ORAL | 0 refills | Status: AC

## 2018-06-07 MED ORDER — FENTANYL CITRATE (PF) 100 MCG/2ML IJ SOLN
50.0000 ug | INTRAMUSCULAR | Status: DC | PRN
  Administered 2018-06-07: 50 ug via INTRAVENOUS
  Filled 2018-06-07: qty 2

## 2018-06-07 MED ORDER — HYDROCODONE-ACETAMINOPHEN 5-325 MG PO TABS: 1.0000 | ORAL_TABLET | ORAL | 0 refills | Status: AC | PRN

## 2018-06-07 MED ORDER — OXYCODONE-ACETAMINOPHEN 5-325 MG PO TABS: 1.0000 | ORAL_TABLET | ORAL | Status: DC | PRN

## 2018-06-07 MED ORDER — ONDANSETRON HCL 4 MG/2ML IJ SOLN
4.0000 mg | Freq: Once | INTRAMUSCULAR | Status: AC
  Administered 2018-06-07: 4 mg via INTRAVENOUS
  Filled 2018-06-07: qty 2

## 2018-06-07 MED ORDER — MORPHINE SULFATE (PF) 4 MG/ML IV SOLN
4.0000 mg | Freq: Once | INTRAVENOUS | Status: AC
  Administered 2018-06-07: 4 mg via INTRAVENOUS
  Filled 2018-06-07: qty 1

## 2018-06-07 MED ORDER — KETOROLAC TROMETHAMINE 30 MG/ML IJ SOLN
15.0000 mg | Freq: Once | INTRAMUSCULAR | Status: AC
  Administered 2018-06-07: 15 mg via INTRAVENOUS
  Filled 2018-06-07: qty 1

## 2018-06-07 NOTE — ED Notes (Signed)
Pt spouse at bedside

## 2018-06-07 NOTE — ED Notes (Signed)
Pt requested water. Water provided with EDP approval.

## 2018-06-07 NOTE — ED Notes (Signed)
Patient transported to Ultrasound 

## 2018-06-07 NOTE — ED Triage Notes (Signed)
Pt in via [facility], reports daughter walking on his back, slipped, landing on testicles.  Pt with severe pain and uncomfortable in triage.

## 2018-06-07 NOTE — ED Provider Notes (Signed)
[facility] Emergency Department Provider Note    First MD Initiated Contact with Patient 06/07/18 2108     (approximate)  I have reviewed the triage vital signs and the nursing notes.   HISTORY  Chief Complaint Testicle Pain    HPI Jared Meyer is a 28 y.o. male presents with acute severe bilateral testicle pain that occurred this evening shortly prior to arrival.  Patient states he got home from work and his 89-year-old daughter was walking on his back to stretch him out and when she was walking on his his bottom she slipped falling onto his testicle stabbing.  States his pain is 10 out of 10 and severe.  Is never had pain like this before.    History reviewed. No pertinent past medical history. No family history on file. History reviewed. No pertinent surgical history. There are no active problems to display for this patient.     Prior to Admission medications   Medication Sig Start Date End Date Taking? Authorizing Provider  cetirizine (ZYRTEC) 10 MG tablet Take 10 mg by mouth daily.    [provider]  etodolac (LODINE) 400 MG tablet Take 1 tablet (400 mg total) by mouth 2 (two) times daily. 06/10/15   Tommi Rumps, PA-C  fluticasone (FLONASE) 50 MCG/ACT nasal spray Place 2 sprays into both nostrils 2 (two) times daily.    [provider]  HYDROcodone-acetaminophen (NORCO) 5-325 MG tablet Take 1 tablet by mouth every 4 (four) hours as needed for moderate pain. 06/07/18   Willy Eddy, MD  naproxen (NAPROSYN) 500 MG tablet Take 1 tablet (500 mg total) by mouth 2 (two) times daily with a meal. 06/07/18 06/07/19  Willy Eddy, MD  oxyCODONE-acetaminophen (PERCOCET/ROXICET) 5-325 MG tablet Take 1 tablet by mouth every 6 (six) hours as needed for severe pain. 01/29/18   Cuthriell, Delorise Royals, PA-C  sulfamethoxazole-trimethoprim (BACTRIM DS,SEPTRA DS) 800-160 MG tablet Take 1 tablet by mouth 2 (two) times daily. 01/29/18    Cuthriell, Delorise Royals, PA-C    Allergies Patient has no known allergies.    Social History Social History   Tobacco Use  . Smoking status: Current Every Day Smoker    Packs/day: 0.50    Types: Cigarettes  . Smokeless tobacco: Never Used  Substance Use Topics  . Alcohol use: Yes    Comment: occasionally  . Drug use: Never    Review of Systems Patient denies headaches, rhinorrhea, blurry vision, numbness, shortness of breath, chest pain, edema, cough, abdominal pain, nausea, vomiting, diarrhea, dysuria, fevers, rashes or hallucinations unless otherwise stated above in HPI. ____________________________________________   PHYSICAL EXAM:  VITAL SIGNS: Vitals:   06/07/18 2135 06/07/18 2321  BP: 109/77 137/70  Pulse: 95 86  Resp: 20 18  Temp:    SpO2: 96% 96%    Constitutional: Alert and oriented.  Eyes: Conjunctivae are normal.  Head: Atraumatic. Nose: No congestion/rhinnorhea. Mouth/Throat: Mucous membranes are moist.   Neck: No stridor. Painless ROM.  Cardiovascular: Normal rate, regular rhythm. Grossly normal heart sounds.  Good peripheral circulation. Respiratory: Normal respiratory effort.  No retractions. Lungs CTAB. Gastrointestinal: Soft and nontender. No distention. No abdominal bruits. No CVA tenderness. Genitourinary: Normal external genitalia.  No scrotal swelling.  Cremasteric reflex present bilaterally but does have severe pain with any palpation of the testicles.  No scrotal ecchymosis.  No penile injury.  No evidence of perennial injury Musculoskeletal: No lower extremity tenderness nor edema.  No joint effusions. Neurologic:  Normal  speech and language. No gross focal neurologic deficits are appreciated. No facial droop Skin:  Skin is warm, dry and intact. No rash noted. Psychiatric: Mood and affect are normal. Speech and behavior are normal.  ____________________________________________   LABS (all labs ordered are listed, but only abnormal results  are displayed)  Results for orders placed or performed during the hospital encounter of 06/07/18 (from the past 24 hour(s))  Urinalysis, Complete w Microscopic     Status: Abnormal   Collection Time: 06/07/18  9:26 PM  Result Value Ref Range   Color, Urine YELLOW (A) YELLOW   APPearance CLEAR (A) CLEAR   Specific Gravity, Urine 1.012 1.005 - 1.030   pH 6.0 5.0 - 8.0   Glucose, UA NEGATIVE NEGATIVE mg/dL   Hgb urine dipstick MODERATE (A) NEGATIVE   Bilirubin Urine NEGATIVE NEGATIVE   Ketones, ur NEGATIVE NEGATIVE mg/dL   Protein, ur NEGATIVE NEGATIVE mg/dL   Nitrite NEGATIVE NEGATIVE   Leukocytes, UA NEGATIVE NEGATIVE   RBC / HPF 0-5 0 - 5 RBC/hpf   WBC, UA 0-5 0 - 5 WBC/hpf   Bacteria, UA NONE SEEN NONE SEEN   Squamous Epithelial / LPF 0-5 0 - 5   Mucus PRESENT    ____________________________________________ ____________________________________________  RADIOLOGY  I personally reviewed all radiographic images ordered to evaluate for the above acute complaints and reviewed radiology reports and findings.  These findings were personally discussed with the patient.  Please see medical record for radiology report.  ____________________________________________   PROCEDURES  Procedure(s) performed:  Procedures    Critical Care performed: no ____________________________________________   INITIAL IMPRESSION / ASSESSMENT AND PLAN / ED COURSE  Pertinent labs & imaging results that were available during my care of the patient were reviewed by me and considered in my medical decision making (see chart for details).   DDX: torsion, rupture, epididymitis  Jared Meyer is a 28 y.o. who presents to the ED with acute scrotum and testicular pain after trauma induced by his daughter as described above.  He is afebrile but very uncomfortable appearing.  Exam is reassuring.  Possible torsion but no clear evidence of this clinically.  Has intact reflexes bilaterally.  No relief after  manual detorsion efforts.  Pain seems to be simply worsened by any contact with the testicle therefore ultrasound will be ordered.  Will provide IV pain medication.  Will check urinalysis.  His abdominal exam is soft and benign.  No evidence of other associated injury.  Clinical Course as of Jun 07 2336  Morey Hummingbirdhu Jun 07, 2018  2205 Analysis is normal.  Patient more comfortable after IV morphine.   [PR]  2333 Patient reassessed.  Repeat abdominal exam soft and benign.  Patient appears more comfortable.  Ultrasound is reassuring with no evidence of torsion mass contusion or hematoma.  This point patient stable and appropriate for outpatient follow-up.   [PR]    Clinical Course User Index [PR] Willy Eddyobinson, Shameika Speelman, MD     As part of my medical decision making, I reviewed the following data within the electronic MEDICAL RECORD NUMBER Nursing notes reviewed and incorporated, Labs reviewed, notes from prior ED visits and [place] Controlled Substance Database   ____________________________________________   FINAL CLINICAL IMPRESSION(S) / ED DIAGNOSES  Final diagnoses:  Pain in testicle due to trauma      NEW MEDICATIONS STARTED DURING THIS VISIT:  New Prescriptions   HYDROCODONE-ACETAMINOPHEN (NORCO) 5-325 MG TABLET    Take 1 tablet by mouth every 4 (four) hours  as needed for moderate pain.   NAPROXEN (NAPROSYN) 500 MG TABLET    Take 1 tablet (500 mg total) by mouth 2 (two) times daily with a meal.     Note:  This document was prepared using Dragon voice recognition software and may include unintentional dictation errors.    Willy Eddy, MD 06/07/18 [phone]

## 2019-05-14 ENCOUNTER — Ambulatory Visit: Payer: HRSA Program | Attending: Internal Medicine

## 2019-05-14 DIAGNOSIS — Z20822 Contact with and (suspected) exposure to covid-19: Secondary | ICD-10-CM

## 2019-05-14 DIAGNOSIS — Z20828 Contact with and (suspected) exposure to other viral communicable diseases: Secondary | ICD-10-CM | POA: Diagnosis not present

## 2019-05-15 LAB — NOVEL CORONAVIRUS, NAA: SARS-CoV-2, NAA: NOT DETECTED

## 2019-06-12 IMAGING — US US SCROTUM W/ DOPPLER COMPLETE
1 series · 14 of 25 positions shown · non-contrast
Comparison: None.

CLINICAL DATA: Testicle pain after trauma 2 hours ago.

EXAM:
SCROTAL ULTRASOUND
DOPPLER ULTRASOUND OF THE TESTICLES
TECHNIQUE: Complete ultrasound examination of the testicles, epididymis, and
other scrotal structures was performed. Color and spectral Doppler
ultrasound were also utilized to evaluate blood flow to the
testicles.

[Series 1: us scrotum w/ doppler complete · 0.08mm/px · 14 of 65 slices shown]
[im 1/65]
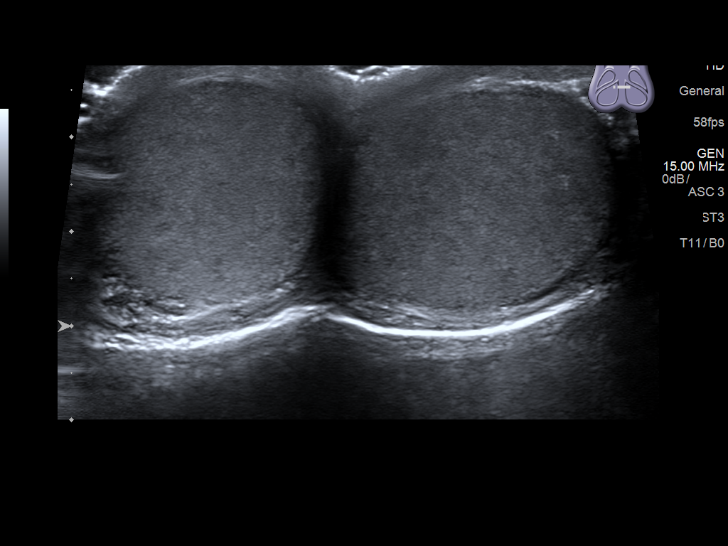
[im 6/65]
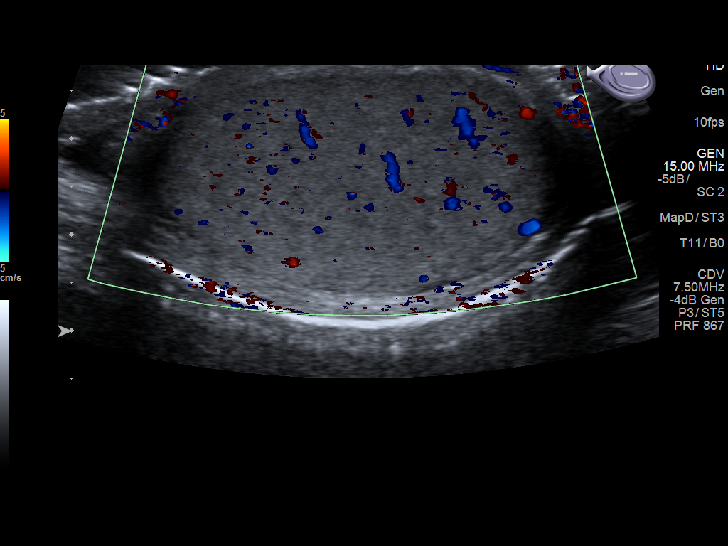
[im 11/65]
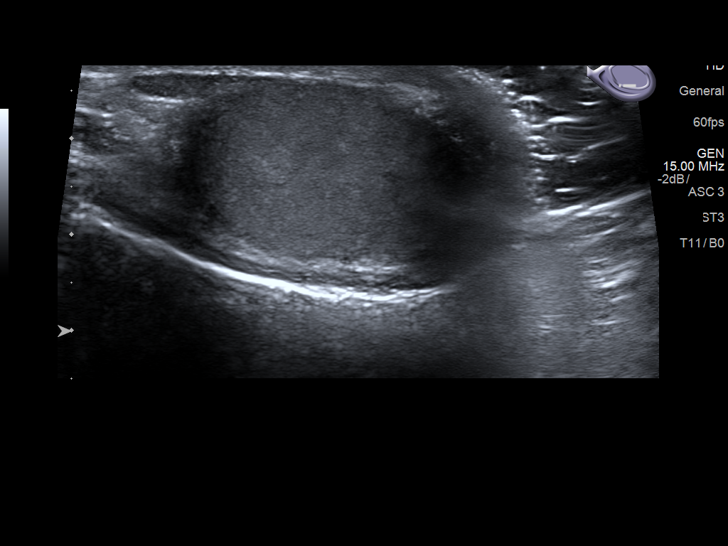
[im 17/65]
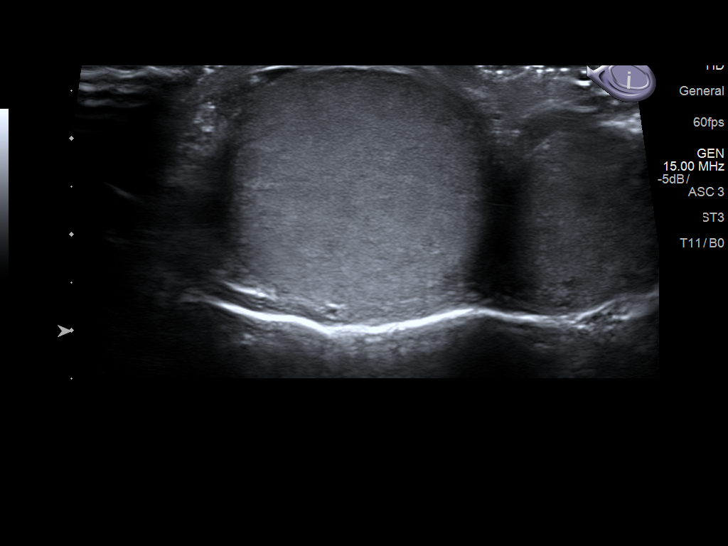
[im 22/65]
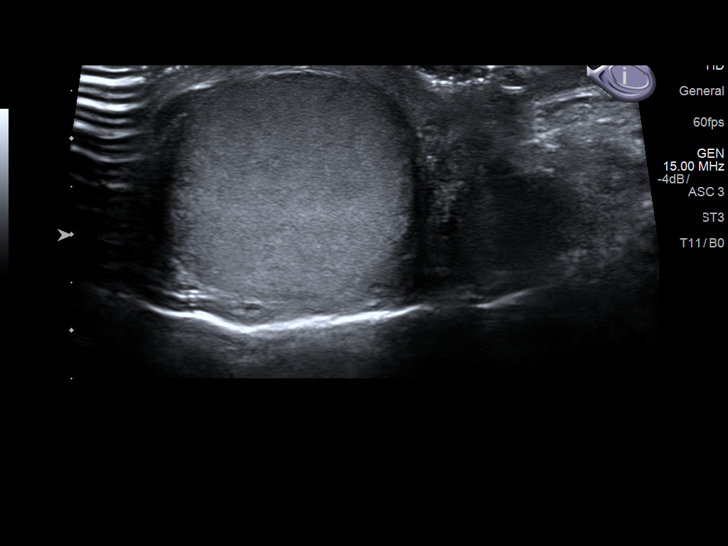
[im 25/65]
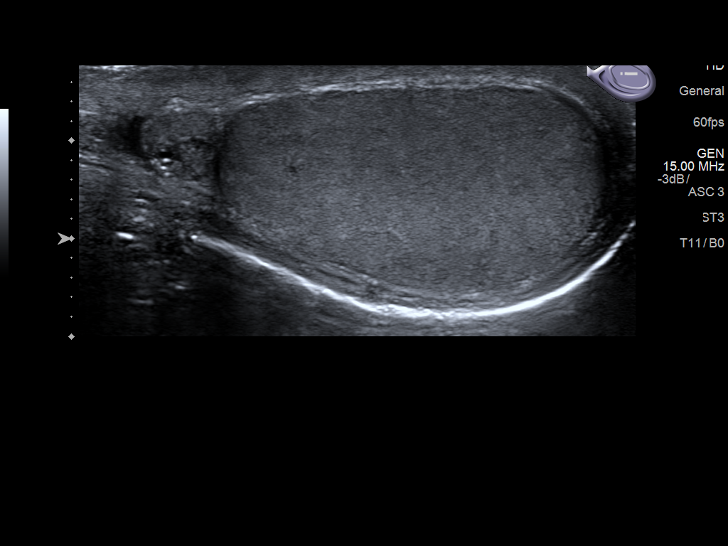
[im 30/65]
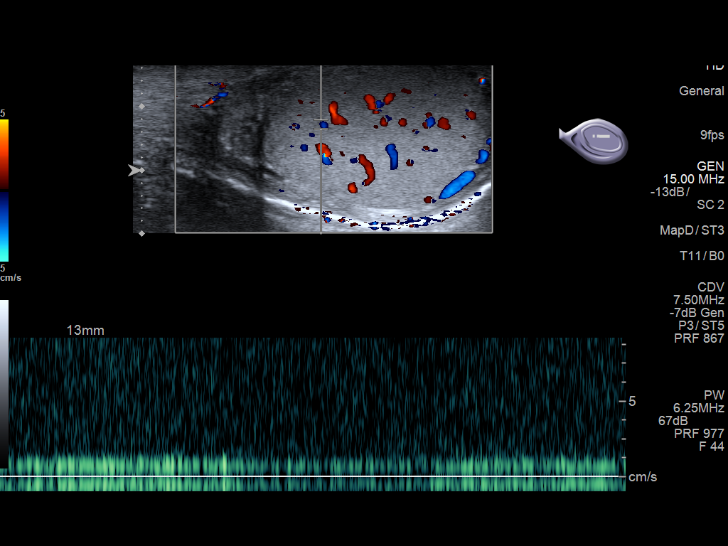
[im 35/65]
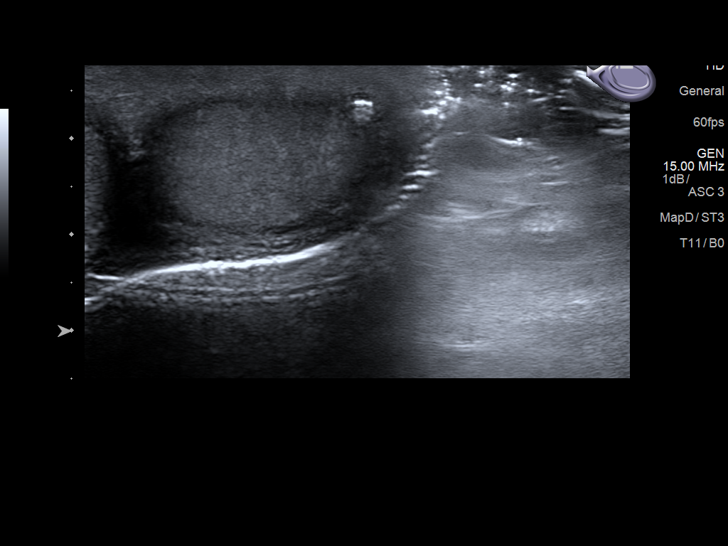
[im 41/65]
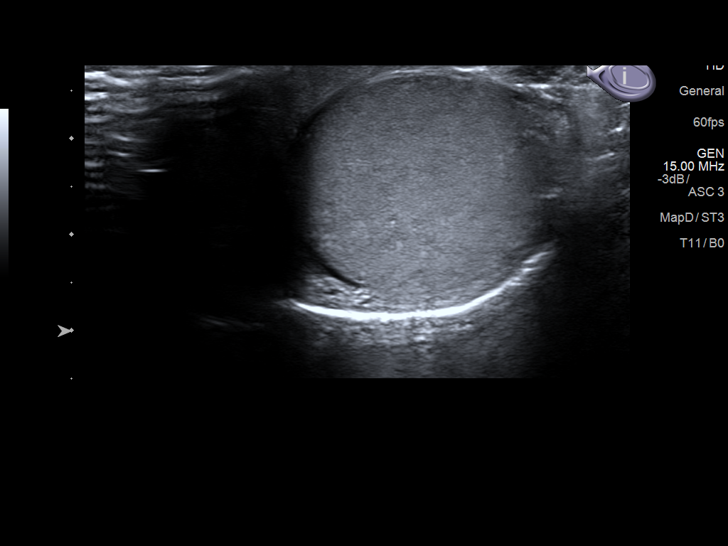
[im 43/65]
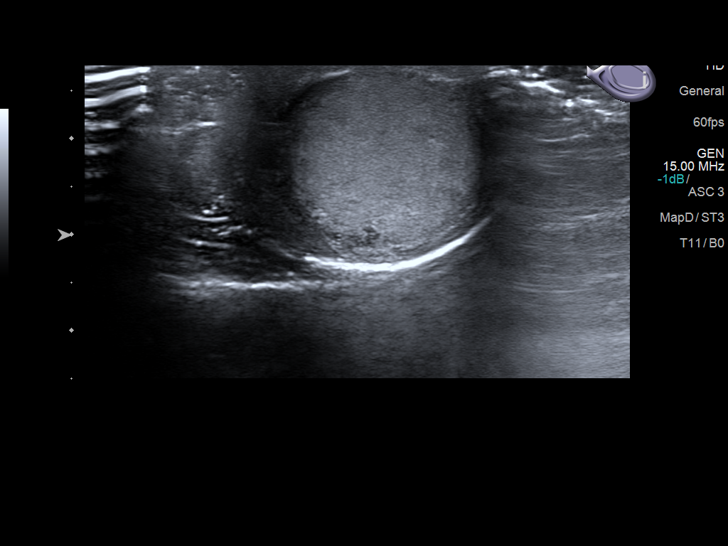
[im 49/65]
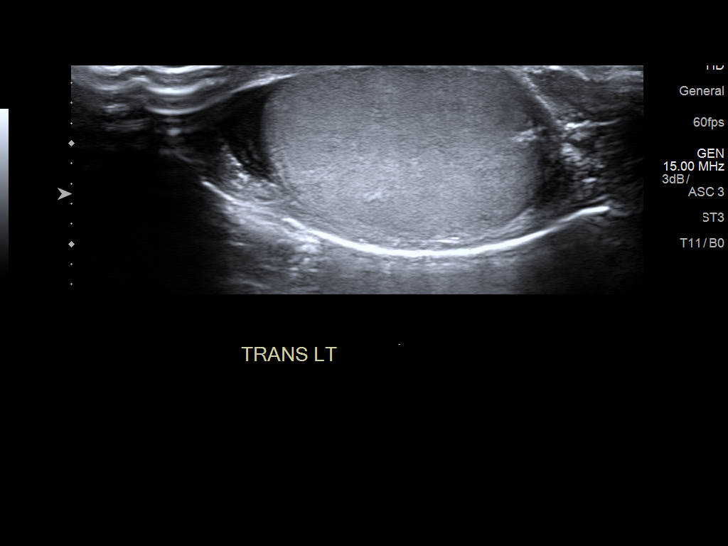
[im 54/65]
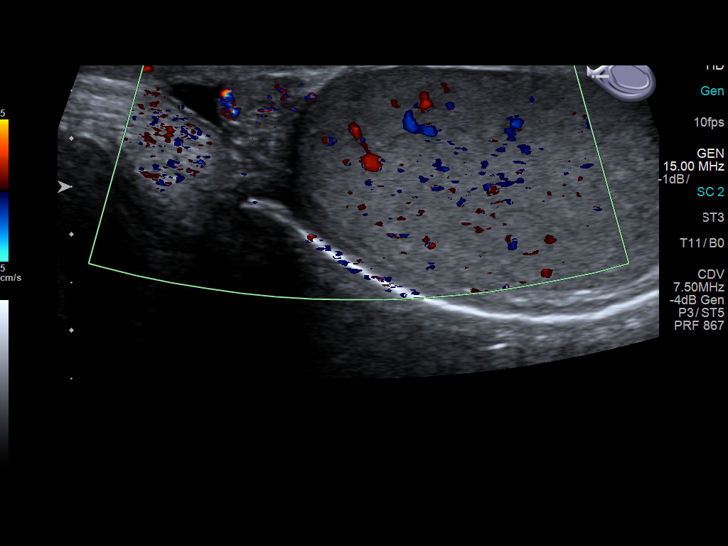
[im 59/65]
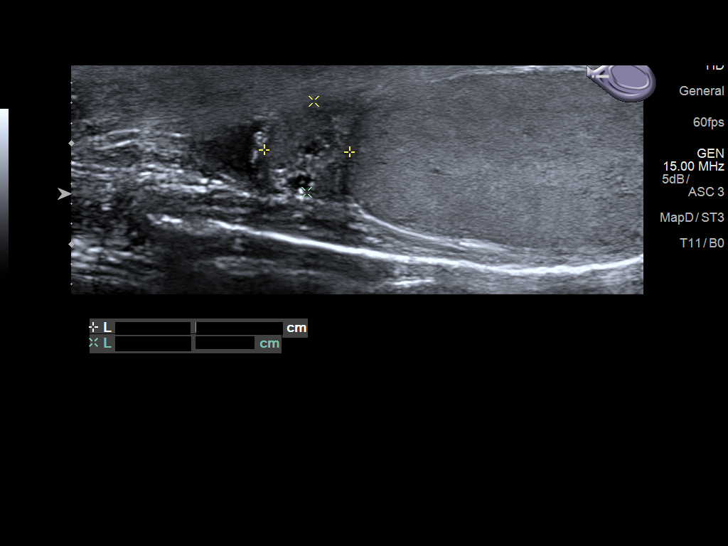
[im 65/65]
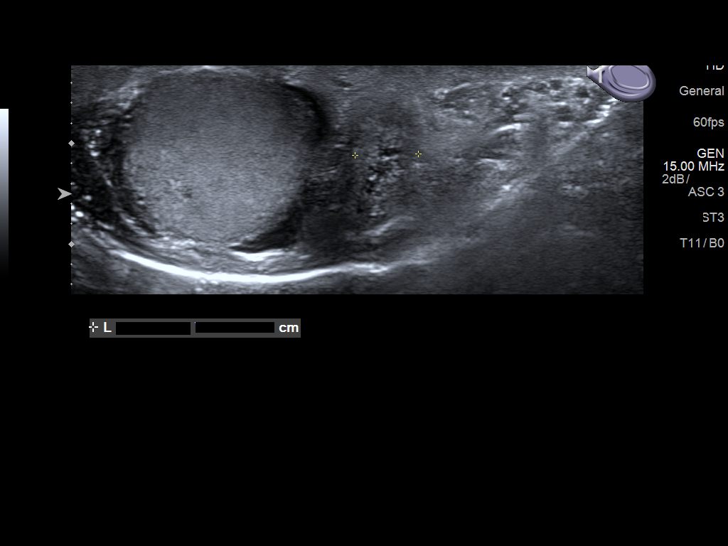

[14 of 25 positions shown; findings below may reference images not displayed]

FINDINGS: Right testicle

Measurements: 4.1 x 2.1 x 2.6 cm. No mass or microlithiasis
visualized.

Left testicle

Measurements: 3.9 x 2.1 x 2.6 cm. No mass or microlithiasis
visualized.2 mm extratesticular scrotolith.

Right epididymis:  Normal in size and appearance.

Left epididymis: Nonacute. Multiple anechoic simple cyst measuring
to 2.9 cm.

Hydrocele:  None visualized.

Varicocele:  None visualized.

Pulsed Doppler interrogation of both testes demonstrates normal low
resistance arterial and venous waveforms bilaterally.
IMPRESSION: 1. No acute process in the scrotum, intact testicles.

## 2023-09-05 ENCOUNTER — Encounter: Payer: Self-pay | Admitting: Emergency Medicine

## 2023-09-05 ENCOUNTER — Other Ambulatory Visit: Payer: Self-pay

## 2023-09-05 ENCOUNTER — Ambulatory Visit
Admission: EM | Admit: 2023-09-05 | Discharge: 2023-09-05 | Disposition: A | Discharge status: Home / Self Care | Payer: MEDICAID

## 2023-09-05 DIAGNOSIS — J069 Acute upper respiratory infection, unspecified: Secondary | ICD-10-CM

## 2023-09-05 MED ORDER — BENZONATATE 100 MG PO CAPS: 100.0000 mg | ORAL_CAPSULE | Freq: Three times a day (TID) | ORAL | 0 refills | Status: AC

## 2023-09-05 MED ORDER — PREDNISONE 10 MG (21) PO TBPK: ORAL_TABLET | Freq: Every day | ORAL | 0 refills | Status: AC

## 2023-09-05 MED ORDER — AMOXICILLIN 500 MG PO CAPS: 500.0000 mg | ORAL_CAPSULE | Freq: Two times a day (BID) | ORAL | 0 refills | Status: AC

## 2023-09-05 NOTE — Discharge Instructions (Signed)
 Begin amoxicillin  twice daily for 7 days to cover for bacteria  Begin prednisone  every morning with food as directed to open and relax airway, should help with shortness of breath and wheezing  May use Tessalon  pill every 8 hours as needed for cough  You can take Tylenol  and/or Ibuprofen  as needed for fever reduction and pain relief.   For cough: honey 1/2 to 1 teaspoon (you can dilute the honey in water or another fluid).  You can also use guaifenesin and dextromethorphan for cough. You can use a humidifier for chest congestion and cough.  If you don't have a humidifier, you can sit in the bathroom with the hot shower running.      For sore throat: try warm salt water gargles, cepacol lozenges, throat spray, warm tea or water with lemon/honey, popsicles or ice, or OTC cold relief medicine for throat discomfort.   For congestion: take a daily anti-histamine like Zyrtec, Claritin, and a oral decongestant, such as pseudoephedrine.  You can also use Flonase 1-2 sprays in each nostril daily.   It is important to stay hydrated: drink plenty of fluids (water, gatorade/powerade/pedialyte, juices, or teas) to keep your throat moisturized and help further relieve irritation/discomfort.

## 2023-09-05 NOTE — ED Triage Notes (Addendum)
 Patient presents to [facility] for evaluation of runny nose, watery eyes, cough and fever x 1 week.  Concerned for pneumonia.  Guest in room states he sounds "terrible" when she lays her head on his chest.  States she gave him some of her albuterol inhaler this morning, patient states it very minimally helped.  Two negative home COVID tests

## 2023-09-05 NOTE — ED Provider Notes (Signed)
 [name]    CSN: [id number] Arrival date & time: 09/05/23  1001      History   Chief Complaint Chief Complaint  Patient presents with   Cough    HPI Jared Meyer is a 33 y.o. male.   Patient presents for evaluation of subjective fever, nasal congestion, rhinorrhea, shortness of breath with exertion, intermittent wheezing, headaches, bilateral ear fullness and popping and a sore throat present for 7 days.  Sore throat has resolved.  No known sick contacts prior.  Tolerating food and liquids.  Has attempted use of TheraFlu, DayQuil and over-the-counter cold and flu medicine.  Daily tobacco use.  Home COVID test negative x 2.  Did attempt use of  friend's inhaler which was only minimally helpful.  History reviewed. No pertinent past medical history.  There are no active problems to display for this patient.   History reviewed. No pertinent surgical history.     Home Medications    Prior to Admission medications   Medication Sig Start Date End Date Taking? Authorizing Provider  amoxicillin  (AMOXIL ) 500 MG capsule Take 1 capsule (500 mg total) by mouth 2 (two) times daily for 7 days. 09/05/23 09/12/23 Yes Ovie Eastep R, NP  benzonatate  (TESSALON ) 100 MG capsule Take 1 capsule (100 mg total) by mouth every 8 (eight) hours. 09/05/23  Yes Mohammad Granade R, NP  buprenorphine-naloxone (SUBOXONE) 2-0.5 mg SUBL SL tablet Place under the tongue daily.   Yes [provider]  predniSONE  (STERAPRED UNI-PAK 21 TAB) 10 MG (21) TBPK tablet Take by mouth daily. Take 6 tabs by mouth daily  for 1 days, then 5 tabs for 1 days, then 4 tabs for 1 days, then 3 tabs for 1 days, 2 tabs for 1 days, then 1 tab by mouth daily for 1 days 09/05/23  Yes Aitanna Haubner R, NP  cetirizine (ZYRTEC) 10 MG tablet Take 10 mg by mouth daily.    [provider]  etodolac  (LODINE ) 400 MG tablet Take 1 tablet (400 mg total) by mouth 2 (two) times daily. 06/10/15   Stafford Eagles, PA-C   fluticasone (FLONASE) 50 MCG/ACT nasal spray Place 2 sprays into both nostrils 2 (two) times daily.    [provider]  HYDROcodone -acetaminophen  (NORCO) 5-325 MG tablet Take 1 tablet by mouth every 4 (four) hours as needed for moderate pain. 06/07/18   Suellyn Emory, MD  oxyCODONE -acetaminophen  (PERCOCET/ROXICET) 5-325 MG tablet Take 1 tablet by mouth every 6 (six) hours as needed for severe pain. 01/29/18   Cuthriell, Ardath Bears, PA-C  sulfamethoxazole -trimethoprim  (BACTRIM  DS,SEPTRA  DS) 800-160 MG tablet Take 1 tablet by mouth 2 (two) times daily. 01/29/18   Cuthriell, Ardath Bears, PA-C    Family History History reviewed. No pertinent family history.  Social History Social History   Tobacco Use   Smoking status: Every Day    Current packs/day: 0.50    Types: Cigarettes   Smokeless tobacco: Never  Vaping Use   Vaping status: Never Used  Substance Use Topics   Alcohol use: Yes    Comment: occasionally   Drug use: Never     Allergies   Patient has no known allergies.   Review of Systems Review of Systems   Physical Exam Triage Vital Signs ED Triage Vitals  Encounter Vitals Group     BP 09/05/23 1036 (!) 151/99     Systolic BP Percentile --      Diastolic BP Percentile --      Pulse Rate  09/05/23 1036 99     Resp 09/05/23 1036 18     Temp 09/05/23 1036 99.2 F (37.3 C)     Temp Source 09/05/23 1036 Oral     SpO2 09/05/23 1036 97 %     Weight --      Height --      Head Circumference --      Peak Flow --      Pain Score 09/05/23 1037 6     Pain Loc --      Pain Education --      Exclude from Growth Chart --    No data found.  Updated Vital Signs BP (!) 151/99 (BP Location: Left Arm)   Pulse 99   Temp 99.2 F (37.3 C) (Oral) Comment: took excedrin and cough and cold meds this morning  Resp 18   SpO2 97%   Visual Acuity Right Eye Distance:   Left Eye Distance:   Bilateral Distance:    Right Eye Near:   Left Eye Near:    Bilateral Near:      Physical Exam Constitutional:      Appearance: Normal appearance.  HENT:     Head: Normocephalic.     Right Ear: Tympanic membrane, ear canal and external ear normal.     Left Ear: Tympanic membrane, ear canal and external ear normal.     Nose: Congestion present.     Mouth/Throat:     Mouth: Mucous membranes are moist.     Pharynx: Oropharynx is clear. Posterior oropharyngeal erythema present. No oropharyngeal exudate.  Eyes:     Extraocular Movements: Extraocular movements intact.  Cardiovascular:     Rate and Rhythm: Normal rate and regular rhythm.     Pulses: Normal pulses.     Heart sounds: Normal heart sounds.  Pulmonary:     Effort: Pulmonary effort is normal.     Breath sounds: Normal breath sounds.  Musculoskeletal:     Cervical back: Normal range of motion and neck supple.  Neurological:     General: No focal deficit present.     Mental Status: He is alert and oriented to person, place, and time. Mental status is at baseline.      UC Treatments / Results  Labs (all labs ordered are listed, but only abnormal results are displayed) Labs Reviewed - No data to display  EKG   Radiology No results found.  Procedures Procedures (including critical care time)  Medications Ordered in UC Medications - No data to display  Initial Impression / Assessment and Plan / UC Course  I have reviewed the triage vital signs and the nursing notes.  Pertinent labs & imaging results that were available during my care of the patient were reviewed by me and considered in my medical decision making (see chart for details).  Acute URI  Patient is in no signs of distress nor toxic appearing.  Vital signs are stable.  Low suspicion for pneumonia, pneumothorax or bronchitis and therefore will defer imaging.  Prescribed amoxicillin , prednisone  and Tessalon .May use additional over-the-counter medications as needed for supportive care.  May follow-up with urgent care as needed if  symptoms persist or worsen.  Note given.   Final Clinical Impressions(s) / UC Diagnoses   Final diagnoses:  Acute URI     Discharge Instructions      Begin amoxicillin  twice daily for 7 days to cover for bacteria  Begin prednisone  every morning with food as directed to open and relax airway,  should help with shortness of breath and wheezing  May use Tessalon  pill every 8 hours as needed for cough  You can take Tylenol  and/or Ibuprofen  as needed for fever reduction and pain relief.   For cough: honey 1/2 to 1 teaspoon (you can dilute the honey in water or another fluid).  You can also use guaifenesin and dextromethorphan for cough. You can use a humidifier for chest congestion and cough.  If you don't have a humidifier, you can sit in the bathroom with the hot shower running.      For sore throat: try warm salt water gargles, cepacol lozenges, throat spray, warm tea or water with lemon/honey, popsicles or ice, or OTC cold relief medicine for throat discomfort.   For congestion: take a daily anti-histamine like Zyrtec, Claritin, and a oral decongestant, such as pseudoephedrine.  You can also use Flonase 1-2 sprays in each nostril daily.   It is important to stay hydrated: drink plenty of fluids (water, gatorade/powerade/pedialyte, juices, or teas) to keep your throat moisturized and help further relieve irritation/discomfort.    ED Prescriptions     Medication Sig Dispense Auth. Provider   amoxicillin  (AMOXIL ) 500 MG capsule Take 1 capsule (500 mg total) by mouth 2 (two) times daily for 7 days. 14 capsule Reyden Smith R, NP   predniSONE  (STERAPRED UNI-PAK 21 TAB) 10 MG (21) TBPK tablet Take by mouth daily. Take 6 tabs by mouth daily  for 1 days, then 5 tabs for 1 days, then 4 tabs for 1 days, then 3 tabs for 1 days, 2 tabs for 1 days, then 1 tab by mouth daily for 1 days 21 tablet Xitlalic Maslin R, NP   benzonatate  (TESSALON ) 100 MG capsule Take 1 capsule (100 mg total) by mouth  every 8 (eight) hours. 21 capsule Anarosa Kubisiak, Maybelle Spatz, NP      PDMP not reviewed this encounter.   Reena Canning, NP 09/05/23 1139
